# Patient Record
Sex: Female | Born: 1980 | Race: Black or African American | Hispanic: No | Marital: Single | Smoking: Current every day smoker
Health system: Southern US, Community
[De-identification: ages and names within clinical notes are randomized; demographics above are authoritative.]

## PROBLEM LIST (undated history)

## (undated) DIAGNOSIS — E119 Type 2 diabetes mellitus without complications: Secondary | ICD-10-CM

## (undated) DIAGNOSIS — K56609 Unspecified intestinal obstruction, unspecified as to partial versus complete obstruction: Secondary | ICD-10-CM

## (undated) HISTORY — PX: APPENDECTOMY: SHX54

---

## 2012-05-12 DIAGNOSIS — K56609 Unspecified intestinal obstruction, unspecified as to partial versus complete obstruction: Secondary | ICD-10-CM

## 2012-05-12 HISTORY — DX: Unspecified intestinal obstruction, unspecified as to partial versus complete obstruction: K56.609

## 2016-04-22 ENCOUNTER — Emergency Department (HOSPITAL_COMMUNITY)
Admission: EM | Admit: 2016-04-22 | Discharge: 2016-04-22 | Disposition: A | Discharge status: Home / Self Care | Payer: Self-pay | Attending: Emergency Medicine | Admitting: Emergency Medicine

## 2016-04-22 ENCOUNTER — Encounter (HOSPITAL_COMMUNITY): Payer: Self-pay | Admitting: Emergency Medicine

## 2016-04-22 DIAGNOSIS — R112 Nausea with vomiting, unspecified: Secondary | ICD-10-CM | POA: Insufficient documentation

## 2016-04-22 DIAGNOSIS — R197 Diarrhea, unspecified: Secondary | ICD-10-CM | POA: Insufficient documentation

## 2016-04-22 DIAGNOSIS — F17210 Nicotine dependence, cigarettes, uncomplicated: Secondary | ICD-10-CM | POA: Insufficient documentation

## 2016-04-22 DIAGNOSIS — E119 Type 2 diabetes mellitus without complications: Secondary | ICD-10-CM | POA: Insufficient documentation

## 2016-04-22 HISTORY — DX: Unspecified intestinal obstruction, unspecified as to partial versus complete obstruction: K56.609

## 2016-04-22 HISTORY — DX: Type 2 diabetes mellitus without complications: E11.9

## 2016-04-22 LAB — URINALYSIS, ROUTINE W REFLEX MICROSCOPIC
Bilirubin Urine: NEGATIVE
Glucose, UA: NEGATIVE mg/dL
Hgb urine dipstick: NEGATIVE
KETONES UR: NEGATIVE mg/dL
Leukocytes, UA: NEGATIVE
Nitrite: NEGATIVE
PH: 5 (ref 5.0–8.0)
PROTEIN: NEGATIVE mg/dL
Specific Gravity, Urine: 1.019 (ref 1.005–1.030)

## 2016-04-22 LAB — COMPREHENSIVE METABOLIC PANEL
ALT: 13 U/L — ABNORMAL LOW (ref 14–54)
ANION GAP: 6 (ref 5–15)
AST: 14 U/L — AB (ref 15–41)
Albumin: 3.4 g/dL — ABNORMAL LOW (ref 3.5–5.0)
Alkaline Phosphatase: 61 U/L (ref 38–126)
BILIRUBIN TOTAL: 0.4 mg/dL (ref 0.3–1.2)
BUN: 13 mg/dL (ref 6–20)
CHLORIDE: 106 mmol/L (ref 101–111)
CO2: 24 mmol/L (ref 22–32)
Calcium: 8.4 mg/dL — ABNORMAL LOW (ref 8.9–10.3)
Creatinine, Ser: 0.84 mg/dL (ref 0.44–1.00)
Glucose, Bld: 105 mg/dL — ABNORMAL HIGH (ref 65–99)
POTASSIUM: 3.9 mmol/L (ref 3.5–5.1)
Sodium: 136 mmol/L (ref 135–145)
Total Protein: 6.9 g/dL (ref 6.5–8.1)

## 2016-04-22 LAB — CBC
HEMATOCRIT: 35.1 % — AB (ref 36.0–46.0)
Hemoglobin: 11 g/dL — ABNORMAL LOW (ref 12.0–15.0)
MCH: 25.9 pg — ABNORMAL LOW (ref 26.0–34.0)
MCHC: 31.3 g/dL (ref 30.0–36.0)
MCV: 82.8 fL (ref 78.0–100.0)
Platelets: 306 10*3/uL (ref 150–400)
RBC: 4.24 MIL/uL (ref 3.87–5.11)
RDW: 14.6 % (ref 11.5–15.5)
WBC: 3.8 10*3/uL — AB (ref 4.0–10.5)

## 2016-04-22 LAB — LIPASE, BLOOD: LIPASE: 23 U/L (ref 11–51)

## 2016-04-22 MED ORDER — ONDANSETRON HCL 4 MG/2ML IJ SOLN
4.0000 mg | Freq: Once | INTRAMUSCULAR | Status: AC
Start: 2016-04-22 — End: 2016-04-22
  Administered 2016-04-22: 4 mg via INTRAVENOUS
  Filled 2016-04-22: qty 2

## 2016-04-22 MED ORDER — MORPHINE SULFATE (PF) 4 MG/ML IV SOLN
4.0000 mg | Freq: Once | INTRAVENOUS | Status: AC
  Administered 2016-04-22: 4 mg via INTRAVENOUS
  Filled 2016-04-22: qty 1

## 2016-04-22 MED ORDER — PROMETHAZINE HCL 25 MG PO TABS: 25.0000 mg | ORAL_TABLET | Freq: Four times a day (QID) | ORAL | 0 refills | Status: DC | PRN

## 2016-04-22 MED ORDER — ONDANSETRON HCL 4 MG/2ML IJ SOLN
4.0000 mg | Freq: Once | INTRAMUSCULAR | Status: AC
  Administered 2016-04-22: 4 mg via INTRAVENOUS
  Filled 2016-04-22: qty 2

## 2016-04-22 MED ORDER — DIPHENOXYLATE-ATROPINE 2.5-0.025 MG PO TABS: 2.0000 | ORAL_TABLET | Freq: Four times a day (QID) | ORAL | 0 refills | Status: DC | PRN

## 2016-04-22 MED ORDER — SODIUM CHLORIDE 0.9 % IV BOLUS (SEPSIS)
1000.0000 mL | Freq: Once | INTRAVENOUS | Status: AC
  Administered 2016-04-22: 1000 mL via INTRAVENOUS

## 2016-04-22 MED ORDER — SODIUM CHLORIDE 0.9 % IV BOLUS (SEPSIS)
1000.0000 mL | Freq: Once | INTRAVENOUS | Status: AC
  Administered 2016-04-22: 2000 mL via INTRAVENOUS

## 2016-04-22 MED ORDER — MORPHINE SULFATE (PF) 4 MG/ML IV SOLN
4.0000 mg | Freq: Once | INTRAVENOUS | Status: AC
Start: 2016-04-22 — End: 2016-04-22
  Administered 2016-04-22: 4 mg via INTRAVENOUS
  Filled 2016-04-22: qty 1

## 2016-04-22 NOTE — ED Provider Notes (Signed)
[facility] Provider Note   CSN: [id number] Arrival date & time: 04/22/16  0813  By signing my name below, I, Alison Keller, attest that this documentation has been prepared under the direction and in the presence of Alison HutchingBrian Luann Aspinwall, MD  Electronically Signed: Clovis PuAvnee Keller, ED Scribe. 04/22/16. 8:46 AM.  History   Chief Complaint Chief Complaint  Patient presents with  . Abdominal Pain   The history is provided by the patient. No language interpreter was used.   HPI Comments:  Alison Keller is a 35 y.o. female, with a hx of DM (controlled by metformin) and PSHx of appendectomy, who presents to the Emergency Department complaining of sudden onset, moderate abdominal pain x 3 days. Pt states she does not routinely check her sugar level. Associated symptoms includes nausea, vomiting and diarrhea. She states her emesis looks yellow and her stool looks like dirty water. Pt denies hematochezia, any other associated symptoms and modifying factors at this time.    Past Medical History:  Diagnosis Date  . Bowel obstruction 2014  . Diabetes mellitus without complication (HCC)     There are no active problems to display for this patient.   Past Surgical History:  Procedure Laterality Date  . APPENDECTOMY    . CESAREAN SECTION      OB History    No data available       Home Medications    Prior to Admission medications   Medication Sig Start Date End Date Taking? Authorizing Provider  diphenoxylate-atropine (LOMOTIL) 2.5-0.025 MG tablet Take 2 tablets by mouth 4 (four) times daily as needed for diarrhea or loose stools. 04/22/16   Alison HutchingBrian Alison Pruiett, MD  promethazine (PHENERGAN) 25 MG tablet Take 1 tablet (25 mg total) by mouth every 6 (six) hours as needed. 04/22/16   Alison HutchingBrian Alison Bazar, MD    Family History No family history on file.  Social History Social History  Substance Use Topics  . Smoking status: Current Every Day Smoker    Packs/day: 0.25    Types: Cigarettes  .  Smokeless tobacco: Former NeurosurgeonUser  . Alcohol use No     Allergies   Patient has no known allergies.   Review of Systems Review of Systems  Constitutional: Negative for fever.  Gastrointestinal: Positive for abdominal pain, diarrhea, nausea and vomiting. Negative for blood in stool.  All other systems reviewed and are negative.  Physical Exam Updated Vital Signs BP 98/78   Pulse 61   Temp 98 F (36.7 C) (Oral)   Resp 22   Ht 5\' 6"  (1.676 m)   Wt 210 lb (95.3 kg)   LMP 04/11/2016   SpO2 94%   BMI 33.89 kg/m   Physical Exam  Constitutional: She is oriented to person, place, and time. She appears well-developed and well-nourished.  Vomiting   HENT:  Head: Normocephalic and atraumatic.  Eyes: Conjunctivae are normal.  Neck: Neck supple.  Cardiovascular: Normal rate and regular rhythm.   Pulmonary/Chest: Effort normal and breath sounds normal.  Abdominal: Soft. Bowel sounds are normal. There is tenderness.  Tenderness to RUQ  Musculoskeletal: Normal range of motion.  Neurological: She is alert and oriented to person, place, and time.  Skin: Skin is warm and dry.  Psychiatric: She has a normal mood and affect. Her behavior is normal.  Nursing note and vitals reviewed.   ED Treatments / Results  DIAGNOSTIC STUDIES:  Oxygen Saturation is 99% on RA, normal by my interpretation.    COORDINATION OF CARE:  8:41 AM  Will order IV fluids, antiemetic and labs. Discussed treatment plan with pt at bedside and pt agreed to plan.  Labs (all labs ordered are listed, but only abnormal results are displayed) Labs Reviewed  COMPREHENSIVE METABOLIC PANEL - Abnormal; Notable for the following:       Result Value   Glucose, Bld 105 (*)    Calcium 8.4 (*)    Albumin 3.4 (*)    AST 14 (*)    ALT 13 (*)    All other components within normal limits  CBC - Abnormal; Notable for the following:    WBC 3.8 (*)    Hemoglobin 11.0 (*)    HCT 35.1 (*)    MCH 25.9 (*)    All other  components within normal limits  LIPASE, BLOOD  URINALYSIS, ROUTINE W REFLEX MICROSCOPIC    EKG  EKG Interpretation None       Radiology No results found.  Procedures Procedures (including critical care time)  Medications Ordered in ED Medications  ondansetron (ZOFRAN) injection 4 mg (4 mg Intravenous Given 04/22/16 0855)  sodium chloride 0.9 % bolus 1,000 mL (0 mLs Intravenous Stopped 04/22/16 1108)  morphine 4 MG/ML injection 4 mg (4 mg Intravenous Given 04/22/16 0856)  sodium chloride 0.9 % bolus 1,000 mL (0 mLs Intravenous Stopped 04/22/16 1108)  morphine 4 MG/ML injection 4 mg (4 mg Intravenous Given 04/22/16 1101)  ondansetron (ZOFRAN) injection 4 mg (4 mg Intravenous Given 04/22/16 1100)  sodium chloride 0.9 % bolus 1,000 mL (1,000 mLs Intravenous New Bag/Given 04/22/16 1251)     Initial Impression / Assessment and Plan / ED Course  I have reviewed the triage vital signs and the nursing notes.  Pertinent labs & imaging results that were available during my care of the patient were reviewed by me and considered in my medical decision making (see chart for details).  Clinical Course     Patient feels much better after 3 L of IV fluids. No acute abdomen. She is ambulatory and passing urine. Diarrhea has decreased. She is nontoxic at discharge.  Discharge medications Phenergan 25 mg and Lomotil  Final Clinical Impressions(s) / ED Diagnoses   Final diagnoses:  Nausea vomiting and diarrhea    New Prescriptions New Prescriptions   DIPHENOXYLATE-ATROPINE (LOMOTIL) 2.5-0.025 MG TABLET    Take 2 tablets by mouth 4 (four) times daily as needed for diarrhea or loose stools.   PROMETHAZINE (PHENERGAN) 25 MG TABLET    Take 1 tablet (25 mg total) by mouth every 6 (six) hours as needed.  I personally performed the services described in this documentation, which was scribed in my presence. The recorded information has been reviewed and is accurate.      Alison HutchingBrian Martese Vanatta,  MD 04/22/16 [phone]

## 2016-04-22 NOTE — ED Triage Notes (Signed)
Pt reports right sided abd pain,n/v/d for last several days. Pt denies any gu symptoms.

## 2016-04-22 NOTE — ED Notes (Signed)
Pt given crackers and gingerale.  Talking on phone and denies complaints.

## 2016-04-22 NOTE — Discharge Instructions (Signed)
Clear liquids for the next several hours. Prescription for nausea and diarrhea medication.

## 2016-04-22 NOTE — ED Notes (Signed)
Pt states she wants something to eat and drink.  Denies abd pain at this time.

## 2016-04-26 ENCOUNTER — Encounter (HOSPITAL_COMMUNITY): Payer: Self-pay | Admitting: Emergency Medicine

## 2016-04-26 ENCOUNTER — Emergency Department (HOSPITAL_COMMUNITY): Payer: Self-pay

## 2016-04-26 ENCOUNTER — Emergency Department (HOSPITAL_COMMUNITY)
Admission: EM | Admit: 2016-04-26 | Discharge: 2016-04-27 | Disposition: A | Discharge status: Home / Self Care | Payer: Self-pay | Attending: Emergency Medicine | Admitting: Emergency Medicine

## 2016-04-26 DIAGNOSIS — F17210 Nicotine dependence, cigarettes, uncomplicated: Secondary | ICD-10-CM | POA: Insufficient documentation

## 2016-04-26 DIAGNOSIS — R112 Nausea with vomiting, unspecified: Secondary | ICD-10-CM | POA: Insufficient documentation

## 2016-04-26 DIAGNOSIS — R109 Unspecified abdominal pain: Secondary | ICD-10-CM

## 2016-04-26 DIAGNOSIS — R1011 Right upper quadrant pain: Secondary | ICD-10-CM | POA: Insufficient documentation

## 2016-04-26 DIAGNOSIS — E119 Type 2 diabetes mellitus without complications: Secondary | ICD-10-CM | POA: Insufficient documentation

## 2016-04-26 DIAGNOSIS — R197 Diarrhea, unspecified: Secondary | ICD-10-CM | POA: Insufficient documentation

## 2016-04-26 LAB — URINALYSIS, ROUTINE W REFLEX MICROSCOPIC
BILIRUBIN URINE: NEGATIVE
GLUCOSE, UA: NEGATIVE mg/dL
Hgb urine dipstick: NEGATIVE
KETONES UR: NEGATIVE mg/dL
Leukocytes, UA: NEGATIVE
Nitrite: NEGATIVE
PH: 5 (ref 5.0–8.0)
Protein, ur: NEGATIVE mg/dL
Specific Gravity, Urine: 1.02 (ref 1.005–1.030)

## 2016-04-26 LAB — COMPREHENSIVE METABOLIC PANEL
ALBUMIN: 3.8 g/dL (ref 3.5–5.0)
ALK PHOS: 66 U/L (ref 38–126)
ALT: 13 U/L — AB (ref 14–54)
AST: 15 U/L (ref 15–41)
Anion gap: 5 (ref 5–15)
BILIRUBIN TOTAL: 0.3 mg/dL (ref 0.3–1.2)
BUN: 12 mg/dL (ref 6–20)
CO2: 26 mmol/L (ref 22–32)
CREATININE: 0.96 mg/dL (ref 0.44–1.00)
Calcium: 8.9 mg/dL (ref 8.9–10.3)
Chloride: 107 mmol/L (ref 101–111)
GFR calc Af Amer: >60 mL/min (ref >60)
GFR calc non Af Amer: >60 mL/min (ref >60)
GLUCOSE: 94 mg/dL (ref 65–99)
POTASSIUM: 3.7 mmol/L (ref 3.5–5.1)
Sodium: 138 mmol/L (ref 135–145)
Total Protein: 7.5 g/dL (ref 6.5–8.1)

## 2016-04-26 LAB — CBC WITH DIFFERENTIAL/PLATELET
BASOS PCT: 1 %
Basophils Absolute: 0 10*3/uL (ref 0.0–0.1)
EOS PCT: 2 %
Eosinophils Absolute: 0.1 10*3/uL (ref 0.0–0.7)
HEMATOCRIT: 38.6 % (ref 36.0–46.0)
Hemoglobin: 12.4 g/dL (ref 12.0–15.0)
LYMPHS PCT: 21 %
Lymphs Abs: 1.2 10*3/uL (ref 0.7–4.0)
MCH: 26.3 pg (ref 26.0–34.0)
MCHC: 32.1 g/dL (ref 30.0–36.0)
MCV: 82 fL (ref 78.0–100.0)
MONO ABS: 0.3 10*3/uL (ref 0.1–1.0)
MONOS PCT: 5 %
NEUTROS ABS: 4.1 10*3/uL (ref 1.7–7.7)
Neutrophils Relative %: 71 %
Platelets: 310 10*3/uL (ref 150–400)
RBC: 4.71 MIL/uL (ref 3.87–5.11)
RDW: 14.8 % (ref 11.5–15.5)
WBC: 5.7 10*3/uL (ref 4.0–10.5)

## 2016-04-26 LAB — LIPASE, BLOOD: Lipase: 24 U/L (ref 11–51)

## 2016-04-26 LAB — PREGNANCY, URINE: Preg Test, Ur: NEGATIVE

## 2016-04-26 MED ORDER — MORPHINE SULFATE (PF) 2 MG/ML IV SOLN
INTRAVENOUS | Status: AC
  Administered 2016-04-26: 4 mg via INTRAVASCULAR
  Filled 2016-04-26: qty 2

## 2016-04-26 MED ORDER — IOPAMIDOL (ISOVUE-300) INJECTION 61%
100.0000 mL | Freq: Once | INTRAVENOUS | Status: AC | PRN
Start: 2016-04-26 — End: 2016-04-26
  Administered 2016-04-26: 100 mL via INTRAVENOUS

## 2016-04-26 MED ORDER — MORPHINE SULFATE (PF) 4 MG/ML IV SOLN
4.0000 mg | INTRAVENOUS | Status: DC | PRN
  Administered 2016-04-26: 4 mg via INTRAVENOUS
  Filled 2016-04-26: qty 1

## 2016-04-26 MED ORDER — ONDANSETRON HCL 4 MG/2ML IJ SOLN
4.0000 mg | INTRAMUSCULAR | Status: AC | PRN
  Administered 2016-04-26 (×2): 4 mg via INTRAVENOUS
  Filled 2016-04-26 (×2): qty 2

## 2016-04-26 NOTE — ED Provider Notes (Signed)
[facility] Provider Note   CSN: [id number] Arrival date & time: 04/26/16  1950     History   Chief Complaint Chief Complaint  Patient presents with  . Abdominal Pain    HPI Shirlean Mylariffany Rivere is a 35 y.o. female.  HPI  Pt was seen at 2040.  Per pt, c/o gradual onset and worsening of constant right sided abd "pain" for the past 1 week. Has been associated with multiple intermittent episodes of N/V/D. Denies fevers, no back pain, no rash, no CP/SOB, no black or blood in stools or emesis. Pt was evaluated in the ED 4 days ago for her symptoms with reassuring workup. Pt states she has been taking the antiemetic as prescribed without improvement.    Past Medical History:  Diagnosis Date  . Bowel obstruction 2014  . Diabetes mellitus without complication (HCC)     There are no active problems to display for this patient.   Past Surgical History:  Procedure Laterality Date  . APPENDECTOMY    . CESAREAN SECTION      OB History    Gravida Para Term Preterm AB Living   2 2 2          SAB TAB Ectopic Multiple Live Births                   Home Medications    Prior to Admission medications   Medication Sig Start Date End Date Taking? Authorizing Provider  diphenoxylate-atropine (LOMOTIL) 2.5-0.025 MG tablet Take 2 tablets by mouth 4 (four) times daily as needed for diarrhea or loose stools. 04/22/16   Donnetta HutchingBrian Cook, MD  promethazine (PHENERGAN) 25 MG tablet Take 1 tablet (25 mg total) by mouth every 6 (six) hours as needed. 04/22/16   Donnetta HutchingBrian Cook, MD    Family History History reviewed. No pertinent family history.  Social History Social History  Substance Use Topics  . Smoking status: Current Every Day Smoker    Packs/day: 0.25    Types: Cigarettes  . Smokeless tobacco: Former NeurosurgeonUser  . Alcohol use Yes     Comment: occas     Allergies   Patient has no known allergies.   Review of Systems Review of Systems ROS: Statement: All systems negative except as  marked or noted in the HPI; Constitutional: Negative for fever and chills. ; ; Eyes: Negative for eye pain, redness and discharge. ; ; ENMT: Negative for ear pain, hoarseness, nasal congestion, sinus pressure and sore throat. ; ; Cardiovascular: Negative for chest pain, palpitations, diaphoresis, dyspnea and peripheral edema. ; ; Respiratory: Negative for cough, wheezing and stridor. ; ; Gastrointestinal: +abd pain, N/V/D. Negative for blood in stool, hematemesis, jaundice and rectal bleeding. . ; ; Genitourinary: Negative for dysuria, flank pain and hematuria. ; ; Musculoskeletal: Negative for back pain and neck pain. Negative for swelling and trauma.; ; Skin: Negative for pruritus, rash, abrasions, blisters, bruising and skin lesion.; ; Neuro: Negative for headache, lightheadedness and neck stiffness. Negative for weakness, altered level of consciousness, altered mental status, extremity weakness, paresthesias, involuntary movement, seizure and syncope.       Physical Exam Updated Vital Signs BP 132/84 (BP Location: Left Arm)   Pulse 101   Temp 98.2 F (36.8 C) (Oral)   Resp 16   Ht 5\' 6"  (1.676 m)   Wt 210 lb (95.3 kg)   LMP 04/11/2016   SpO2 100%   BMI 33.89 kg/m   Physical Exam 2045: Physical examination:  Nursing notes reviewed; Vital  signs and O2 SAT reviewed;  Constitutional: Well developed, Well nourished, Well hydrated, Tearful.; Head:  Normocephalic, atraumatic; Eyes: EOMI, PERRL, No scleral icterus; ENMT: Mouth and pharynx normal, Mucous membranes moist; Neck: Supple, Full range of motion, No lymphadenopathy; Cardiovascular: Regular rate and rhythm, No gallop; Respiratory: Breath sounds clear & equal bilaterally, No wheezes.  Speaking full sentences with ease, Normal respiratory effort/excursion; Chest: Nontender, Movement normal; Abdomen: Soft, +RUQ tenderness to palp. No rebound or guarding. Nondistended, Normal bowel sounds; Genitourinary: No CVA tenderness; Spine:  No midline CS,  TS, LS tenderness. +TTP right lumbar paraspinal muscles and right lateral torso. No rash.;; Extremities: Pulses normal, No tenderness, No edema, No calf edema or asymmetry.; Neuro: AA&Ox3, Major CN grossly intact.  Speech clear. No gross focal motor or sensory deficits in extremities.; Skin: Color normal, Warm, Dry.   ED Treatments / Results  Labs (all labs ordered are listed, but only abnormal results are displayed)   EKG  EKG Interpretation None       Radiology No results found.  Procedures Procedures (including critical care time)  Medications Ordered in ED Medications  morphine 4 MG/ML injection 4 mg (not administered)  ondansetron (ZOFRAN) injection 4 mg (not administered)     Initial Impression / Assessment and Plan / ED Course  I have reviewed the triage vital signs and the nursing notes.  Pertinent labs & imaging results that were available during my care of the patient were reviewed by me and considered in my medical decision making (see chart for details).  MDM Reviewed: previous chart, nursing note and vitals Reviewed previous: labs Interpretation: labs and CT scan    Results for orders placed or performed during the hospital encounter of 04/26/16  Comprehensive metabolic panel  Result Value Ref Range   Sodium 138 135 - 145 mmol/L   Potassium 3.7 3.5 - 5.1 mmol/L   Chloride 107 101 - 111 mmol/L   CO2 26 22 - 32 mmol/L   Glucose, Bld 94 65 - 99 mg/dL   BUN 12 6 - 20 mg/dL   Creatinine, Ser 1.610.96 0.44 - 1.00 mg/dL   Calcium 8.9 8.9 - 09.610.3 mg/dL   Total Protein 7.5 6.5 - 8.1 g/dL   Albumin 3.8 3.5 - 5.0 g/dL   AST 15 15 - 41 U/L   ALT 13 (L) 14 - 54 U/L   Alkaline Phosphatase 66 38 - 126 U/L   Total Bilirubin 0.3 0.3 - 1.2 mg/dL   GFR calc non Af Amer >60 >60 mL/min   GFR calc Af Amer >60 >60 mL/min   Anion gap 5 5 - 15  Lipase, blood  Result Value Ref Range   Lipase 24 11 - 51 U/L  CBC with Differential  Result Value Ref Range   WBC 5.7 4.0 -  10.5 K/uL   RBC 4.71 3.87 - 5.11 MIL/uL   Hemoglobin 12.4 12.0 - 15.0 g/dL   HCT 04.538.6 40.936.0 - 81.146.0 %   MCV 82.0 78.0 - 100.0 fL   MCH 26.3 26.0 - 34.0 pg   MCHC 32.1 30.0 - 36.0 g/dL   RDW 91.414.8 78.211.5 - 95.615.5 %   Platelets 310 150 - 400 K/uL   Neutrophils Relative % 71 %   Neutro Abs 4.1 1.7 - 7.7 K/uL   Lymphocytes Relative 21 %   Lymphs Abs 1.2 0.7 - 4.0 K/uL   Monocytes Relative 5 %   Monocytes Absolute 0.3 0.1 - 1.0 K/uL   Eosinophils Relative 2 %  Eosinophils Absolute 0.1 0.0 - 0.7 K/uL   Basophils Relative 1 %   Basophils Absolute 0.0 0.0 - 0.1 K/uL  Urinalysis, Routine w reflex microscopic  Result Value Ref Range   Color, Urine YELLOW YELLOW   APPearance HAZY (A) CLEAR   Specific Gravity, Urine 1.020 1.005 - 1.030   pH 5.0 5.0 - 8.0   Glucose, UA NEGATIVE NEGATIVE mg/dL   Hgb urine dipstick NEGATIVE NEGATIVE   Bilirubin Urine NEGATIVE NEGATIVE   Ketones, ur NEGATIVE NEGATIVE mg/dL   Protein, ur NEGATIVE NEGATIVE mg/dL   Nitrite NEGATIVE NEGATIVE   Leukocytes, UA NEGATIVE NEGATIVE  Pregnancy, urine  Result Value Ref Range   Preg Test, Ur NEGATIVE NEGATIVE    Ct Abdomen Pelvis W Contrast Result Date: 04/27/2016 CLINICAL DATA:  35 year old female with right-sided abdominal pain and nausea vomiting. EXAM: CT ABDOMEN AND PELVIS WITH CONTRAST TECHNIQUE: Multidetector CT imaging of the abdomen and pelvis was performed using the standard protocol following bolus administration of intravenous contrast. CONTRAST:  ISOVUE-300 IOPAMIDOL (ISOVUE-300) INJECTION 61% COMPARISON:  None. FINDINGS: Lower chest: The visualized lung bases are clear. No intra-abdominal free air or free fluid. Hepatobiliary: No focal liver abnormality is seen. No gallstones, gallbladder wall thickening, or biliary dilatation. Pancreas: Unremarkable. No pancreatic ductal dilatation or surrounding inflammatory changes. Spleen: Normal in size without focal abnormality. Adrenals/Urinary Tract: Adrenal glands  are unremarkable. Kidneys are normal, without renal calculi, focal lesion, or hydronephrosis. Bladder is unremarkable. Stomach/Bowel: There is moderate stool throughout the colon. No evidence of bowel obstruction or active inflammation. Appendectomy. Vascular/Lymphatic: No significant vascular findings are present. No enlarged abdominal or pelvic lymph nodes. Reproductive: The uterus is anteverted and grossly unremarkable. A 2.5 cm right ovarian involuting follicle or corpus luteum is noted. Ultrasound may provide better evaluation of the pelvic structures if clinically indicated. Other: Midline vertical anterior abdominal wall incisional scar. No fluid collection or active inflammation. Musculoskeletal: No acute or significant osseous findings. IMPRESSION: A 2.5 cm right ovarian dominant follicle or corpus luteum. No other acute intra-abdominopelvic pathology identified. Electronically Signed   By: Elgie Collard M.D.   On: 04/27/2016 00:09    0015:  Pt has tol PO well while in the ED without N/V.  No stooling while in the ED. VS remain stable. Pt has been talking on the phone much of her ED visit. Pt requesting another dose of meds and then d/c. Pt requesting rx zofran ODT. Continue to tx symptomatically at this time. Dx and testing d/w pt.  Questions answered.  Verb understanding, agreeable to d/c home with outpt f/u.   Final Clinical Impressions(s) / ED Diagnoses   Final diagnoses:  None    New Prescriptions New Prescriptions   No medications on file     Samuel Jester, DO 04/30/16 1552

## 2016-04-26 NOTE — ED Triage Notes (Signed)
Pt seen her on 12/12 for n/v/d and abdominal pain. Pt states she still has not been able to keep anything down, nausea meds are not working. Pt reports RUQ and RLQ abdominal pain that radiates to her R flank. Pt states she is also still having diarrhea.

## 2016-04-27 MED ORDER — ONDANSETRON 4 MG PO TBDP: 4.0000 mg | ORAL_TABLET | Freq: Three times a day (TID) | ORAL | 0 refills | Status: AC | PRN

## 2016-04-27 MED ORDER — ONDANSETRON 8 MG PO TBDP
8.0000 mg | ORAL_TABLET | Freq: Once | ORAL | Status: AC | PRN
  Administered 2016-04-27: 8 mg via ORAL
  Filled 2016-04-27: qty 1

## 2016-04-27 MED ORDER — MORPHINE SULFATE (PF) 4 MG/ML IV SOLN
4.0000 mg | Freq: Once | INTRAVENOUS | Status: AC
  Administered 2016-04-27: 4 mg via INTRAVENOUS
  Filled 2016-04-27: qty 1

## 2016-04-27 NOTE — ED Notes (Signed)
Pt given a cup of water to drink for fluid challenge. 

## 2016-04-27 NOTE — ED Notes (Signed)
Pt tolerated fluid challenge well. 

## 2016-04-27 NOTE — Discharge Instructions (Signed)
Take the prescription as directed.  Increase your fluid intake (ie:  Gatoraide) for the next few days.  Eat a bland diet and advance to your regular diet slowly as you can tolerate it.   Avoid full strength juices, as well as milk and milk products until your diarrhea has resolved.   Call your regular medical doctor Monday to schedule a follow up appointment this week.  Return to the Emergency Department immediately sooner if worsening.

## 2018-01-07 IMAGING — CT CT ABD-PELV W/ CM
2 of 4 series · 16 of 46 positions shown, 18 images · IV contrast (iopamidol)
Comparison: None.

CLINICAL DATA: 35-year-old female with right-sided abdominal pain
and nausea vomiting.

EXAM:
CT ABDOMEN AND PELVIS WITH CONTRAST
TECHNIQUE: Multidetector CT imaging of the abdomen and pelvis was performed
using the standard protocol following bolus administration of
intravenous contrast.
CONTRAST:  100mL 23S2SL-QGG IOPAMIDOL (23S2SL-QGG) INJECTION 61%

[Series 2: axial st · axial · 0.96mm/px · z∈[+827,+1277]mm · 13 of 98 slices shown, 15 images]
[im 4/98  soft-tissue]
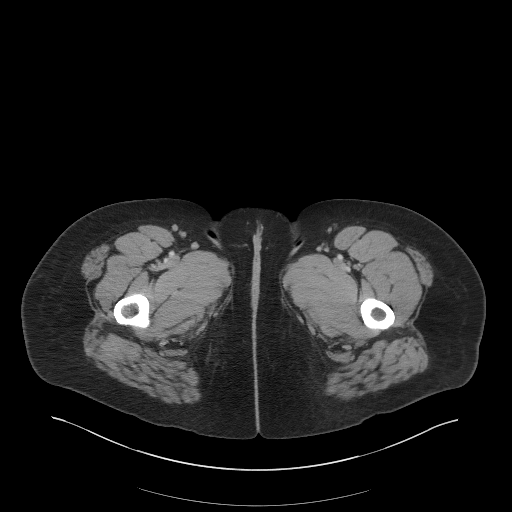
[im 4/98  bone]
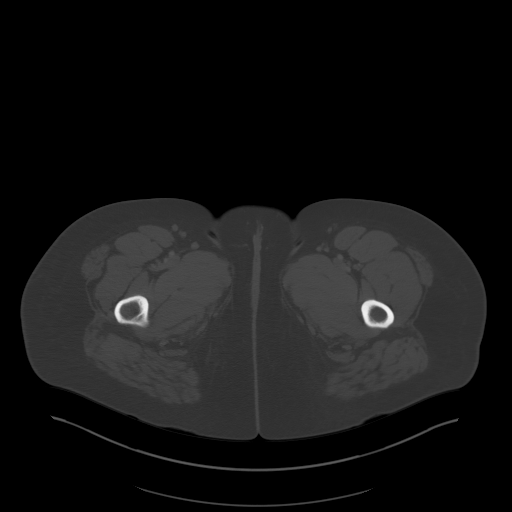
[im 12/98  soft-tissue]
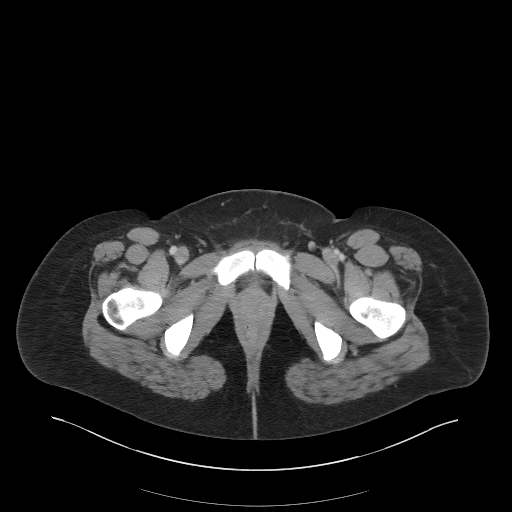
[im 20/98  soft-tissue]
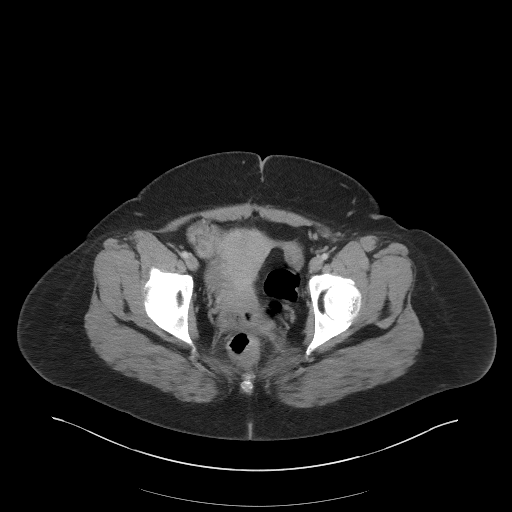
[im 28/98  soft-tissue]
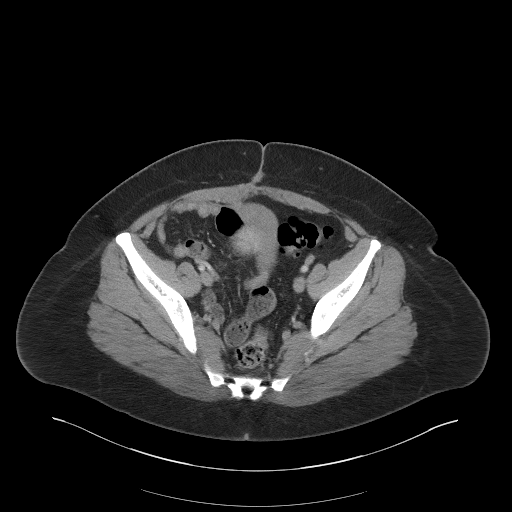
[im 35/98  soft-tissue]
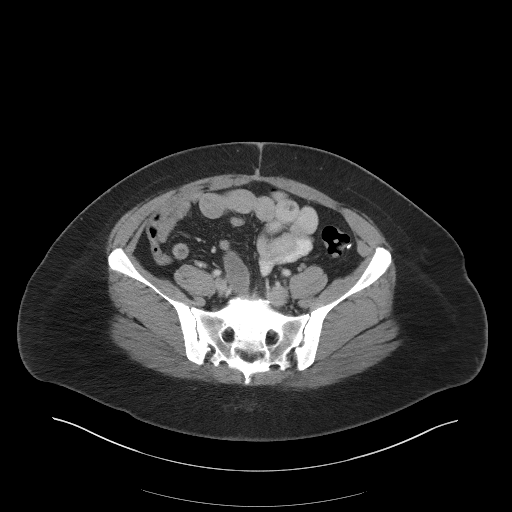
[im 43/98  soft-tissue]
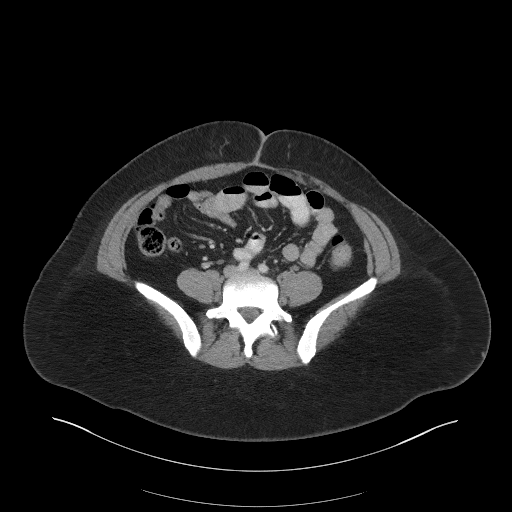
[im 51/98  soft-tissue]
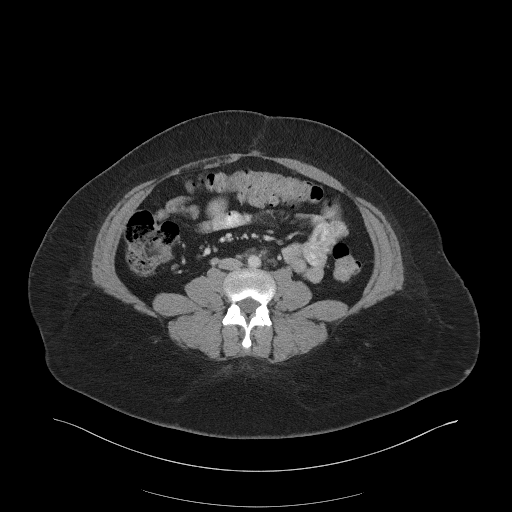
[im 55/98  soft-tissue]
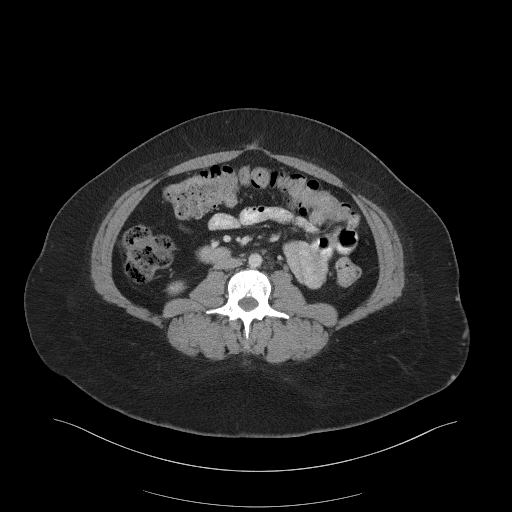
[im 63/98  soft-tissue]
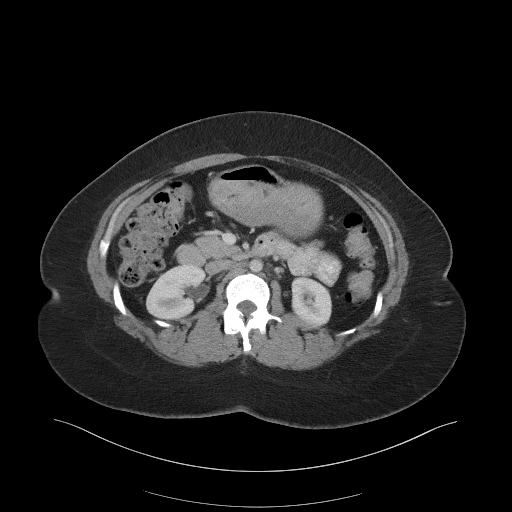
[im 63/98  bone]
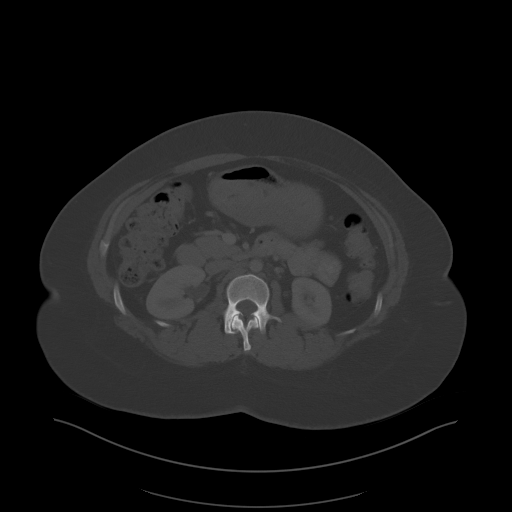
[im 70/98  soft-tissue]
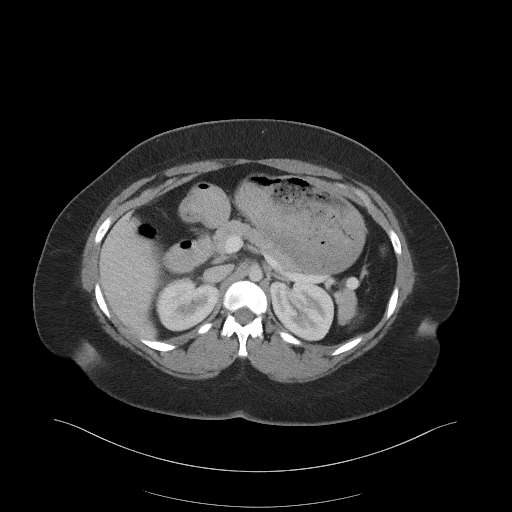
[im 78/98  soft-tissue]
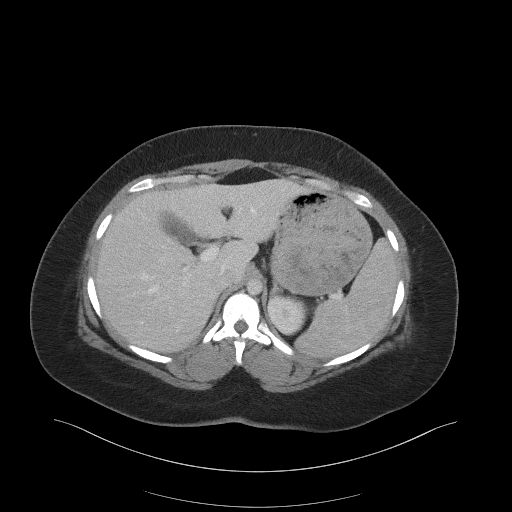
[im 86/98  soft-tissue]
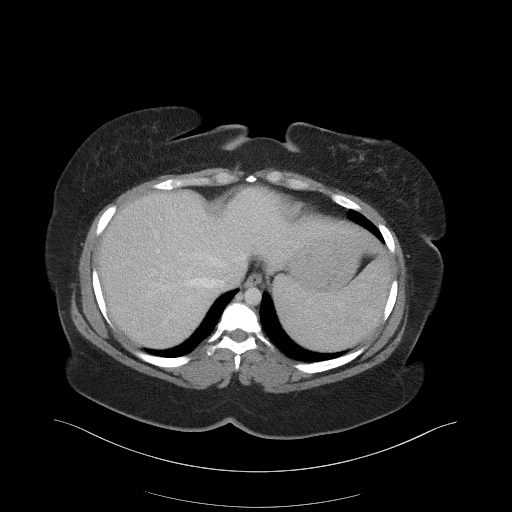
[im 94/98  soft-tissue]
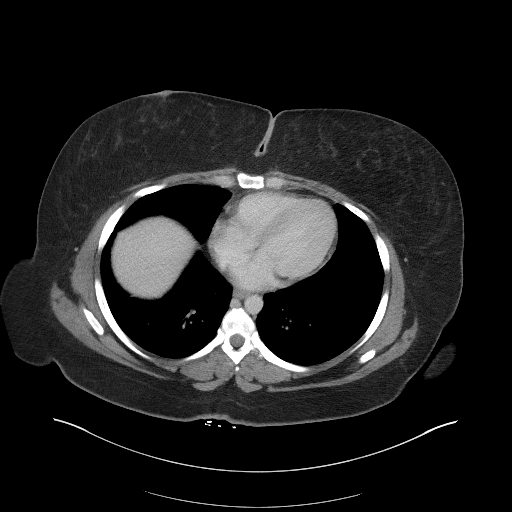

[Series 4: coronal st · coronal · 0.79mm/px · 3 of 103 slices shown]
[im 35/103  soft-tissue]
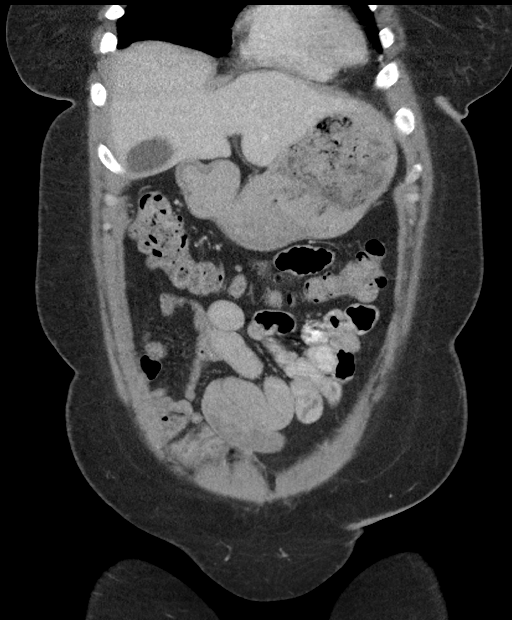
[im 46/103  soft-tissue]
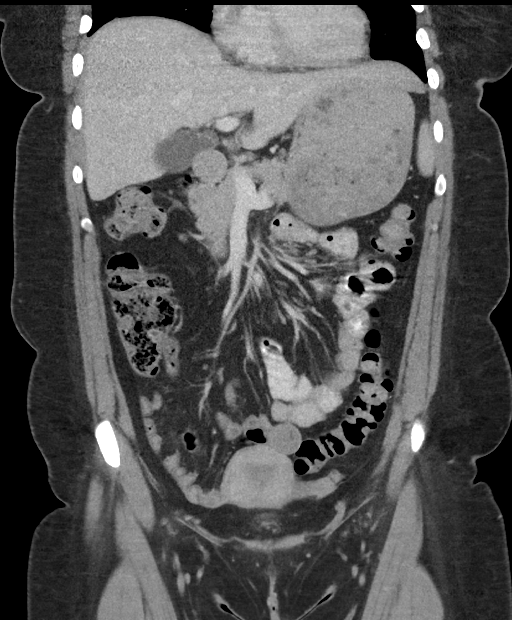
[im 57/103  soft-tissue]
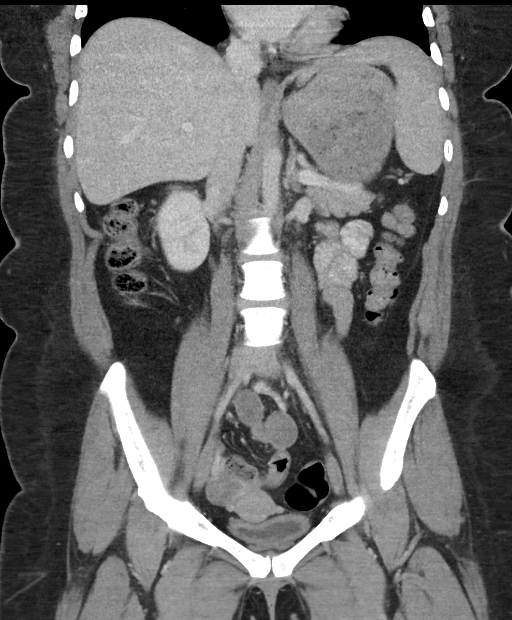

[16 of 46 positions shown; findings below may reference images not displayed]

FINDINGS: Lower chest: The visualized lung bases are clear.

No intra-abdominal free air or free fluid.

Hepatobiliary: No focal liver abnormality is seen. No gallstones,
gallbladder wall thickening, or biliary dilatation.

Pancreas: Unremarkable. No pancreatic ductal dilatation or
surrounding inflammatory changes.

Spleen: Normal in size without focal abnormality.

Adrenals/Urinary Tract: Adrenal glands are unremarkable. Kidneys are
normal, without renal calculi, focal lesion, or hydronephrosis.
Bladder is unremarkable.

Stomach/Bowel: There is moderate stool throughout the colon. No
evidence of bowel obstruction or active inflammation. Appendectomy.

Vascular/Lymphatic: No significant vascular findings are present. No
enlarged abdominal or pelvic lymph nodes.

Reproductive: The uterus is anteverted and grossly unremarkable. A
2.5 cm right ovarian involuting follicle or corpus luteum is noted.
Ultrasound may provide better evaluation of the pelvic structures if
clinically indicated.

Other: Midline vertical anterior abdominal wall incisional scar. No
fluid collection or active inflammation.

Musculoskeletal: No acute or significant osseous findings.
IMPRESSION: A 2.5 cm right ovarian dominant follicle or corpus luteum. No other
acute intra-abdominopelvic pathology identified.
